# Patient Record
Sex: Female | Born: 2000 | Race: White | Hispanic: No | Marital: Single | Smoking: Never smoker
Health system: Southern US, Community
[De-identification: ages and names within clinical notes are randomized; demographics above are authoritative.]

## PROBLEM LIST (undated history)

## (undated) DIAGNOSIS — R209 Unspecified disturbances of skin sensation: Secondary | ICD-10-CM

## (undated) DIAGNOSIS — F840 Autistic disorder: Secondary | ICD-10-CM

## (undated) DIAGNOSIS — F32A Depression, unspecified: Secondary | ICD-10-CM

## (undated) DIAGNOSIS — F410 Panic disorder [episodic paroxysmal anxiety] without agoraphobia: Secondary | ICD-10-CM

## (undated) DIAGNOSIS — F319 Bipolar disorder, unspecified: Secondary | ICD-10-CM

## (undated) DIAGNOSIS — F419 Anxiety disorder, unspecified: Secondary | ICD-10-CM

## (undated) DIAGNOSIS — F988 Other specified behavioral and emotional disorders with onset usually occurring in childhood and adolescence: Secondary | ICD-10-CM

## (undated) DIAGNOSIS — F329 Major depressive disorder, single episode, unspecified: Secondary | ICD-10-CM

## (undated) DIAGNOSIS — F4320 Adjustment disorder, unspecified: Secondary | ICD-10-CM

## (undated) HISTORY — DX: Bipolar disorder, unspecified: F31.9

## (undated) HISTORY — DX: Autistic disorder: F84.0

## (undated) HISTORY — DX: Anxiety disorder, unspecified: F41.9

## (undated) HISTORY — DX: Depression, unspecified: F32.A

## (undated) HISTORY — DX: Major depressive disorder, single episode, unspecified: F32.9

## (undated) HISTORY — PX: TONSILLECTOMY: SUR1361

---
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
## 2022-04-21 ENCOUNTER — Ambulatory Visit
Admission: EM | Admit: 2022-04-21 | Discharge: 2022-04-21 | Disposition: A | Discharge status: Home / Self Care | Payer: Medicaid Other | Attending: Urgent Care | Admitting: Urgent Care

## 2022-04-21 DIAGNOSIS — J309 Allergic rhinitis, unspecified: Secondary | ICD-10-CM

## 2022-04-21 DIAGNOSIS — J320 Chronic maxillary sinusitis: Secondary | ICD-10-CM | POA: Diagnosis not present

## 2022-04-21 MED ORDER — PSEUDOEPHEDRINE HCL 60 MG PO TABS: 60.0000 mg | ORAL_TABLET | Freq: Three times a day (TID) | ORAL | 0 refills | Status: DC | PRN

## 2022-04-21 MED ORDER — AMOXICILLIN-POT CLAVULANATE 875-125 MG PO TABS: 1.0000 | ORAL_TABLET | Freq: Two times a day (BID) | ORAL | 0 refills | Status: DC

## 2022-04-21 MED ORDER — CETIRIZINE HCL 10 MG PO TABS: 10.0000 mg | ORAL_TABLET | Freq: Every day | ORAL | 3 refills | Status: DC

## 2022-04-21 NOTE — ED Provider Notes (Signed)
[facility]  Note:  This document was prepared using Conservation officer, historic buildings and may include unintentional dictation errors.  MRN: [id number] DOB: [birth year]  Subjective:   Rhonda Valdez is a 21 y.o. female presenting for 1 week history of persistent sinus congestion, postnasal drainage, coughing that is eliciting chest discomfort.  Patient has a history of allergic rhinitis but does not take any of her medications consistently for this.  Currently she has not taken anything chronically.  She does get Depo injections.  No overt shortness of breath or wheezing.  No current facility-administered medications for this encounter.  Current Outpatient Medications:    benzonatate (TESSALON) 100 MG capsule, Take 1 capsule (100 mg total) by mouth every 8 (eight) hours as needed for cough., Disp: 21 capsule, Rfl: 0   cetirizine (ZYRTEC) 10 MG tablet, Take 1 tablet (10 mg total) by mouth daily., Disp: 30 tablet, Rfl: 0   cloNIDine (CATAPRES) 0.1 MG tablet, Take 0.1 mg by mouth at bedtime., Disp: , Rfl:    DULoxetine (CYMBALTA) 20 MG capsule, Take 20 mg by mouth 2 (two) times daily., Disp: , Rfl:    fluticasone (FLONASE) 50 MCG/ACT nasal spray, Place 1 spray into both nostrils daily for 3 days., Disp: 16 g, Rfl: 0   hydrOXYzine (ATARAX/VISTARIL) 10 MG tablet, Take 10 mg by mouth daily as needed for anxiety., Disp: , Rfl:    lidocaine (LIDODERM) 5 %, Place 1 patch onto the skin daily. Remove & Discard patch within 12 hours or as directed by MD, Disp: 30 patch, Rfl: 0   medroxyPROGESTERone (DEPO-PROVERA) 150 MG/ML injection, Inject into the muscle., Disp: , Rfl:    Melatonin 5 MG TABS, Take 5 mg by mouth at bedtime., Disp: , Rfl:    Allergies  Allergen Reactions   Milk-Related Compounds Other (See Comments)    Lactose intolerance as a baby, now make her feel bad     Past Medical History:  Diagnosis Date   ADD (attention deficit disorder)    Adjustment disorder     Anxiety    Depression    Panic    Sensory disorder      Past Surgical History:  Procedure Laterality Date   TONSILLECTOMY      Family History  Problem Relation Age of Onset   Anxiety disorder Mother    Bipolar disorder Father    Sexual abuse Sister    Anxiety disorder Sister    Bipolar disorder Paternal Uncle    Bipolar disorder Paternal Grandfather     Social History   Tobacco Use   Smoking status: Never   Smokeless tobacco: Never  Vaping Use   Vaping Use: Never used  Substance Use Topics   Alcohol use: No   Drug use: No    ROS   Objective:   Vitals: BP (!) 142/84 (BP Location: Left Arm)   Pulse 89   Temp 99.2 F (37.3 C) (Oral)   Resp 20   SpO2 98%   Physical Exam Constitutional:      General: She is not in acute distress.    Appearance: Normal appearance. She is well-developed and normal weight. She is not ill-appearing, toxic-appearing or diaphoretic.  HENT:     Head: Normocephalic and atraumatic.     Right Ear: Tympanic membrane, ear canal and external ear normal. No drainage or tenderness. No middle ear effusion. There is no impacted cerumen. Tympanic membrane is not erythematous or bulging.     Left Ear:  Tympanic membrane, ear canal and external ear normal. No drainage or tenderness.  No middle ear effusion. There is no impacted cerumen. Tympanic membrane is not erythematous or bulging.     Nose: Congestion and rhinorrhea present.     Comments: Nasal mucosa boggy and erythematous over the left side, left-sided maxillary sinus tenderness.    Mouth/Throat:     Mouth: Mucous membranes are moist. No oral lesions.     Pharynx: No pharyngeal swelling, oropharyngeal exudate, posterior oropharyngeal erythema or uvula swelling.     Tonsils: No tonsillar exudate or tonsillar abscesses.     Comments: Significant postnasal drainage overlying pharynx. Eyes:     General: No scleral icterus.       Right eye: No discharge.        Left eye: No discharge.      Extraocular Movements: Extraocular movements intact.     Right eye: Normal extraocular motion.     Left eye: Normal extraocular motion.     Conjunctiva/sclera: Conjunctivae normal.  Cardiovascular:     Rate and Rhythm: Normal rate and regular rhythm.     Heart sounds: Normal heart sounds. No murmur heard.    No friction rub. No gallop.  Pulmonary:     Effort: Pulmonary effort is normal. No respiratory distress.     Breath sounds: No stridor. No wheezing, rhonchi or rales.  Chest:     Chest wall: No tenderness.  Musculoskeletal:     Cervical back: Normal range of motion and neck supple.  Lymphadenopathy:     Cervical: No cervical adenopathy.  Skin:    General: Skin is warm and dry.  Neurological:     General: No focal deficit present.     Mental Status: She is alert and oriented to person, place, and time.  Psychiatric:        Mood and Affect: Mood normal.        Behavior: Behavior normal.     Assessment and Plan :   PDMP not reviewed this encounter.  1. Left maxillary sinusitis   2. Allergic rhinitis, unspecified seasonality, unspecified trigger     Will start empiric treatment for sinusitis with Augmentin.  Recommended supportive care otherwise.  Will defer steroid use for now.  Deferred imaging given clear cardiopulmonary exam, hemodynamically stable vital signs. Counseled patient on potential for adverse effects with medications prescribed/recommended today, ER and return-to-clinic precautions discussed, patient verbalized understanding.    Wallis Bamberg, [place] 04/22/22 [phone]

## 2022-04-21 NOTE — ED Triage Notes (Signed)
Pt c/o cough, nasal congestion x 1 week-n/v/d x 2 days-NAD-steady gait

## 2022-05-28 ENCOUNTER — Ambulatory Visit: Admit: 2022-05-28 | Payer: No Typology Code available for payment source

## 2022-06-21 ENCOUNTER — Ambulatory Visit
Admission: RE | Admit: 2022-06-21 | Discharge: 2022-06-21 | Disposition: A | Discharge status: Home / Self Care | Payer: Medicaid Other | Source: Ambulatory Visit | Attending: Family Medicine | Admitting: Family Medicine

## 2022-06-21 VITALS — BP 121/82 | HR 90 | Temp 98.6°F | Resp 16

## 2022-06-21 DIAGNOSIS — J069 Acute upper respiratory infection, unspecified: Secondary | ICD-10-CM | POA: Diagnosis not present

## 2022-06-21 DIAGNOSIS — Z1152 Encounter for screening for COVID-19: Secondary | ICD-10-CM | POA: Insufficient documentation

## 2022-06-21 DIAGNOSIS — R058 Other specified cough: Secondary | ICD-10-CM | POA: Insufficient documentation

## 2022-06-21 DIAGNOSIS — Z793 Long term (current) use of hormonal contraceptives: Secondary | ICD-10-CM | POA: Diagnosis not present

## 2022-06-21 DIAGNOSIS — F4325 Adjustment disorder with mixed disturbance of emotions and conduct: Secondary | ICD-10-CM | POA: Insufficient documentation

## 2022-06-21 NOTE — ED Triage Notes (Signed)
Pt states fever,nasal congestion,green drainage from nose and headache for the past 4 days.  States she has been getting frequent sinus infections.

## 2022-06-21 NOTE — ED Provider Notes (Signed)
[facility]    CSN: [id number] Arrival date & time: 06/21/22  1331      History   Chief Complaint Chief Complaint  Patient presents with   Cough    HPI Rhonda Valdez is a 22 y.o. female.    Cough  Here for nasal congestion.  First on February 18 she had malaise and myalgia.  She then had some nausea and some diarrhea on February 19 and 20.  Then on 3/21 she developed nasal congestion and cough and some headache.  She has had subjective fever, but no chills.  No shortness of breath.  She mentions that she has had 2 other sinus infections in the last year.  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      Home Medications    Prior to Admission medications   Medication Sig Start Date End Date Taking? Authorizing Provider  cloNIDine (CATAPRES) 0.1 MG tablet Take 0.1 mg by mouth at bedtime.    [provider]  hydrOXYzine (ATARAX/VISTARIL) 10 MG tablet Take 10 mg by mouth daily as needed for anxiety.    [provider]  medroxyPROGESTERone (DEPO-PROVERA) 150 MG/ML injection Inject into the muscle. 07/27/19   [provider]    Family History Family History  Problem Relation Age of Onset   Anxiety disorder Mother    Bipolar disorder Father    Sexual abuse Sister    Anxiety disorder Sister    Bipolar disorder Paternal Uncle    Bipolar disorder Paternal Grandfather     Social  History Social History   Tobacco Use   Smoking status: Never   Smokeless tobacco: Never  Vaping Use   Vaping Use: Never used  Substance Use Topics   Alcohol use: No   Drug use: No     Allergies   Milk-related compounds   Review of Systems Review of Systems  Respiratory:  Positive for cough.      Physical Exam Triage Vital Signs ED Triage Vitals  Enc Vitals Group     BP 06/21/22 1352 121/82     Pulse Rate 06/21/22 1352 90     Resp 06/21/22 1352 16     Temp 06/21/22 1352 98.6 F (37 C)     Temp Source 06/21/22 1352 Oral     SpO2 06/21/22 1352 98 %     Weight --      Height --      Head Circumference --      Peak Flow --      Pain Score 06/21/22 1353 3     Pain Loc --      Pain Edu? --      Excl. in [place]? --    No data found.  Updated Vital Signs BP 121/82 (BP Location: Left Arm)   Pulse 90   Temp 98.6 F (37 C) (  Oral)   Resp 16   SpO2 98%   Visual Acuity Right Eye Distance:   Left Eye Distance:   Bilateral Distance:    Right Eye Near:   Left Eye Near:    Bilateral Near:     Physical Exam Vitals reviewed.  Constitutional:      General: She is not in acute distress.    Appearance: She is not toxic-appearing.  HENT:     Right Ear: Tympanic membrane and ear canal normal.     Left Ear: Tympanic membrane and ear canal normal.     Nose: Congestion present.     Mouth/Throat:     Mouth: Mucous membranes are moist.     Pharynx: No oropharyngeal exudate (clear mucus in OP) or posterior oropharyngeal erythema.  Eyes:     Extraocular Movements: Extraocular movements intact.     Conjunctiva/sclera: Conjunctivae normal.     Pupils: Pupils are equal, round, and reactive to light.  Cardiovascular:     Rate and Rhythm: Normal rate and regular rhythm.     Heart sounds: No murmur heard. Pulmonary:     Effort: Pulmonary effort is normal. No respiratory distress.     Breath sounds: No stridor. No wheezing, rhonchi or rales.  Musculoskeletal:     Cervical  back: Neck supple.  Lymphadenopathy:     Cervical: No cervical adenopathy.  Skin:    Capillary Refill: Capillary refill takes less than 2 seconds.     Coloration: Skin is not jaundiced or pale.  Neurological:     General: No focal deficit present.     Mental Status: She is alert and oriented to person, place, and time.  Psychiatric:        Behavior: Behavior normal.      UC Treatments / Results  Labs (all labs ordered are listed, but only abnormal results are displayed) Labs Reviewed  SARS CORONAVIRUS 2 (TAT 6-24 HRS)    EKG   Radiology No results found.  Procedures Procedures (including critical care time)  Medications Ordered in UC Medications - No data to display  Initial Impression / Assessment and Plan / UC Course  I have reviewed the triage vital signs and the nursing notes.  Pertinent labs & imaging results that were available during my care of the patient were reviewed by me and considered in my medical decision making (see chart for details).        I discussed with her that in this instance this is most likely a viral upper respiratory infection.  Symptomatic treatment and she is swabbed for COVID.  If she is positive she will know if she needs to quarantine.  Blood pressure is good today and she is not on medication for blood pressure. Final Clinical Impressions(s) / UC Diagnoses   Final diagnoses:  Viral URI     Discharge Instructions       You have been swabbed for COVID, and the test will result in the next 24 hours. Our staff will call you if positive. If the COVID test is positive, you should quarantine for 5 days from the start of your symptoms.  On days 6-10 from the start of your illness, you should wear a mask if out in public.  Try NyQuil/DayQuil or Mucinex D as needed for your congestion.  Saline nasal spray can also help wash out the congestion in your nose.  You can use the QR code/website at the back of the summary paperwork to  schedule yourself a  new patient appointment with primary care      ED Prescriptions   None    PDMP not reviewed this encounter.   Barrett Henle, MD 06/21/22 [phone]

## 2022-06-21 NOTE — Discharge Instructions (Signed)
  You have been swabbed for COVID, and the test will result in the next 24 hours. Our staff will call you if positive. If the COVID test is positive, you should quarantine for 5 days from the start of your symptoms.  On days 6-10 from the start of your illness, you should wear a mask if out in public.  Try NyQuil/DayQuil or Mucinex D as needed for your congestion.  Saline nasal spray can also help wash out the congestion in your nose.  You can use the QR code/website at the back of the summary paperwork to schedule yourself a new patient appointment with primary care

## 2022-06-22 LAB — SARS CORONAVIRUS 2 (TAT 6-24 HRS): SARS Coronavirus 2: NEGATIVE

## 2022-08-27 ENCOUNTER — Encounter: Payer: Self-pay | Admitting: Emergency Medicine

## 2022-08-27 ENCOUNTER — Ambulatory Visit
Admission: EM | Admit: 2022-08-27 | Discharge: 2022-08-27 | Disposition: A | Discharge status: Home / Self Care | Payer: Medicaid Other | Attending: Nurse Practitioner | Admitting: Nurse Practitioner

## 2022-08-27 DIAGNOSIS — Z1152 Encounter for screening for COVID-19: Secondary | ICD-10-CM | POA: Diagnosis not present

## 2022-08-27 DIAGNOSIS — J0100 Acute maxillary sinusitis, unspecified: Secondary | ICD-10-CM | POA: Diagnosis not present

## 2022-08-27 DIAGNOSIS — R0981 Nasal congestion: Secondary | ICD-10-CM | POA: Diagnosis present

## 2022-08-27 DIAGNOSIS — J309 Allergic rhinitis, unspecified: Secondary | ICD-10-CM | POA: Diagnosis present

## 2022-08-27 DIAGNOSIS — M791 Myalgia, unspecified site: Secondary | ICD-10-CM | POA: Diagnosis present

## 2022-08-27 MED ORDER — PREDNISONE 20 MG PO TABS: 40.0000 mg | ORAL_TABLET | Freq: Every day | ORAL | 0 refills | Status: AC

## 2022-08-27 MED ORDER — NAPROXEN 500 MG PO TABS: 500.0000 mg | ORAL_TABLET | Freq: Two times a day (BID) | ORAL | 0 refills | Status: AC

## 2022-08-27 NOTE — Discharge Instructions (Signed)
The clinic will contact you with results of the COVID testing done today if positive I would try changing your allergy medication from Zyrtec to Claritin or Allegra or Xyzal Prednisone daily for 5 days Nasal rinses as tolerated Naproxen twice daily for 5 days for your muscle pain.  Would also use heat to the area with heating pad or warm showers Follow-up with ENT if your symptoms or not improving Please go to the emergency room for any worsening symptoms

## 2022-08-27 NOTE — ED Provider Notes (Signed)
[facility]    CSN: [id number] Arrival date & time: 08/27/22  6578      History   Chief Complaint Chief Complaint  Patient presents with   Nasal Congestion    HPI Rhonda Valdez is a 22 y.o. female  presents for evaluation of URI symptoms for 2 days. Patient reports associated symptoms of sinus pressure/congestion, throat congestion with globus sensation.  She does states she blew out some yellow/green discharge from her nose that seem to help and prove her symptoms.  Now she primarily reports postnasal drip with throat congestion.  Denies N/V/D, fevers, cough, sore throat, body aches, ear pain, shortness of breath. 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 No known sick contacts.  In addition patient reports 2 days of neck pain that extends to upper back.  States is a constant aching pain.  Denies any injury or known inciting event.  Has not taken any medications for this.  Pt has taken nothing OTC for symptoms. Pt has no other concerns at this time.   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      Home Medications    Prior to Admission medications   Medication Sig Start Date End Date Taking? Authorizing Provider  naproxen (NAPROSYN) 500 MG tablet Take 1 tablet (500 mg total) by mouth 2 (two) times daily for 5 days. 08/27/22 09/01/22 Yes Radford Pax, NP  predniSONE (DELTASONE) 20 MG tablet Take 2 tablets (40 mg total) by mouth daily with  breakfast for 5 days. 08/27/22 09/01/22 Yes Radford Pax, NP  cloNIDine (CATAPRES) 0.1 MG tablet Take 0.1 mg by mouth at bedtime.    [provider]  hydrOXYzine (ATARAX/VISTARIL) 10 MG tablet Take 10 mg by mouth daily as needed for anxiety.    [provider]  medroxyPROGESTERone (DEPO-PROVERA) 150 MG/ML injection Inject into the muscle. 07/27/19   [provider]    Family History Family History  Problem Relation Age of Onset   Anxiety disorder Mother    Bipolar disorder Father    Sexual abuse Sister    Anxiety disorder Sister    Bipolar disorder Paternal Uncle    Bipolar disorder Paternal Grandfather     Social History Social History   Tobacco Use   Smoking status: Never   Smokeless tobacco: Never  Vaping Use   Vaping Use: Never used  Substance Use Topics   Alcohol use: No   Drug use: No     Allergies   Milk-related compounds   Review of Systems Review of Systems  HENT:  Positive for congestion, postnasal drip and sinus pressure.   Musculoskeletal:  Positive for neck pain.     Physical Exam Triage Vital Signs ED Triage Vitals  Enc Vitals Group     BP 08/27/22 0841 106/71     Pulse Rate 08/27/22 0841 71     Resp 08/27/22 0841 15  Temp 08/27/22 0841 98.9 F (37.2 C)     Temp Source 08/27/22 0841 Oral     SpO2 08/27/22 0841 99 %     Weight --      Height --      Head Circumference --      Peak Flow --      Pain Score 08/27/22 0840 5     Pain Loc --      Pain Edu? --      Excl. in GC? --    No data found.  Updated Vital Signs BP 106/71 (BP Location: Left Arm)   Pulse 71   Temp 98.9 F (37.2 C) (Oral)   Resp 15   SpO2 99%   Visual Acuity Right Eye Distance:   Left Eye Distance:   Bilateral Distance:    Right Eye Near:   Left Eye Near:    Bilateral Near:     Physical Exam Vitals and nursing note reviewed.  Constitutional:      General: She is not in acute distress.    Appearance: She is well-developed. She is  not ill-appearing.  HENT:     Head: Normocephalic and atraumatic.     Right Ear: Tympanic membrane and ear canal normal.     Left Ear: Tympanic membrane and ear canal normal.     Nose: Congestion and rhinorrhea present.     Right Turbinates: Not swollen or pale.     Left Turbinates: Not swollen or pale.     Right Sinus: Maxillary sinus tenderness present. No frontal sinus tenderness.     Left Sinus: Maxillary sinus tenderness present. No frontal sinus tenderness.     Mouth/Throat:     Mouth: Mucous membranes are moist.     Pharynx: Oropharynx is clear. Uvula midline. No oropharyngeal exudate or posterior oropharyngeal erythema.     Tonsils: No tonsillar exudate or tonsillar abscesses.  Eyes:     Conjunctiva/sclera: Conjunctivae normal.     Pupils: Pupils are equal, round, and reactive to light.  Neck:     Comments: Mild tenderness to palpation to bilateral paracervical spinal muscle muscles that extends to bilateral trapezius muscles.  Full range of motion of neck with pain on active flexion and extension.  Strength 5 out of 5 bilateral upper extremities. Cardiovascular:     Rate and Rhythm: Normal rate and regular rhythm.     Heart sounds: Normal heart sounds.  Pulmonary:     Effort: Pulmonary effort is normal.     Breath sounds: Normal breath sounds.  Musculoskeletal:     Cervical back: Normal range of motion and neck supple. No erythema, signs of trauma, rigidity or torticollis. Pain with movement and muscular tenderness present. No spinous process tenderness. Normal range of motion.  Lymphadenopathy:     Cervical: No cervical adenopathy.  Skin:    General: Skin is warm and dry.  Neurological:     General: No focal deficit present.     Mental Status: She is alert and oriented to person, place, and time.  Psychiatric:        Mood and Affect: Mood normal.        Behavior: Behavior normal.      UC Treatments / Results  Labs (all labs ordered are listed, but only abnormal  results are displayed) Labs Reviewed  SARS CORONAVIRUS 2 (TAT 6-24 HRS)   Recent Results (from the past 2160 hour(s))  SARS CORONAVIRUS 2 (TAT 6-24 HRS) Anterior Nasal Swab  Status: None   Collection Time: 06/21/22  2:15 PM   Specimen: Anterior Nasal Swab  Result Value Ref Range   SARS Coronavirus 2 NEGATIVE NEGATIVE    Comment: (NOTE) SARS-CoV-2 target nucleic acids are NOT DETECTED.  The SARS-CoV-2 RNA is generally detectable in upper and lower respiratory specimens during the acute phase of infection. Negative results do not preclude SARS-CoV-2 infection, do not rule out co-infections with other pathogens, and should not be used as the sole basis for treatment or other patient management decisions. Negative results must be combined with clinical observations, patient history, and epidemiological information. The expected result is Negative.  Fact Sheet for Patients: HairSlick.no  Fact Sheet for Healthcare Providers: quierodirigir.com  This test is not yet approved or cleared by the [place] FDA and  has been authorized for detection and/or diagnosis of SARS-CoV-2 by FDA under an Emergency Use Authorization (EUA). This EUA will remain  in effect (meaning this test can be used) for the duration of the COVID-19 declaration under Se ction 564(b)(1) of the Act, 21 U.S.C. section 360bbb-3(b)(1), unless the authorization is terminated or revoked sooner.  Performed at [facility], [address], [place], [place] [address]     EKG   Radiology No results found.  Procedures Procedures (including critical care time)  Medications Ordered in UC Medications - No data to display  Initial Impression / Assessment and Plan / UC Course  I have reviewed the triage vital signs and the nursing notes.  Pertinent labs & imaging results that were available during my care of the patient were reviewed by me and  considered in my medical decision making (see chart for details).     Reviewed recent labs and progress notes Reviewed exam and symptoms with patient.  No red flags. COVID PCR and will contact if positive Discussed allergic's rhinitis/sinusitis.  As she has been taking Zyrtec advised to change to Allegra, Xyzal, or Claritin Prednisone daily for 5 days Encourage nasal rinses Discussed muscle tension as cause of neck/upper back pain.  Trial of naproxen x 5 days Heat to the back and neck as needed Advised PCP or ENT follow-up if symptoms do not improve ER precautions reviewed and patient verbalized understanding Final Clinical Impressions(s) / UC Diagnoses   Final diagnoses:  Sinus congestion  Allergic rhinitis, unspecified seasonality, unspecified trigger  Acute non-recurrent maxillary sinusitis  Muscle tension pain     Discharge Instructions      The clinic will contact you with results of the COVID testing done today if positive I would try changing your allergy medication from Zyrtec to Claritin or Allegra or Xyzal Prednisone daily for 5 days Nasal rinses as tolerated Naproxen twice daily for 5 days for your muscle pain.  Would also use heat to the area with heating pad or warm showers Follow-up with ENT if your symptoms or not improving Please go to the emergency room for any worsening symptoms   ED Prescriptions     Medication Sig Dispense Auth. Provider   predniSONE (DELTASONE) 20 MG tablet Take 2 tablets (40 mg total) by mouth daily with breakfast for 5 days. 10 tablet Radford Pax, NP   naproxen (NAPROSYN) 500 MG tablet Take 1 tablet (500 mg total) by mouth 2 (two) times daily for 5 days. 10 tablet Radford Pax, NP      PDMP not reviewed this encounter.   Radford Pax, NP 08/27/22 [phone]

## 2022-08-27 NOTE — ED Triage Notes (Signed)
Pt having sinus pressure and congestion that has gotten worse. Reports had thick green-brown blob out of nose and felt temporarily relief in sinuses. Hasn't had medication for congestion   Having neck and back pain that started yesterday. Worse with twisting. Denies falls, lifting, injury. Denies taking medication for pain

## 2022-08-28 LAB — SARS CORONAVIRUS 2 (TAT 6-24 HRS): SARS Coronavirus 2: NEGATIVE

## 2022-09-09 ENCOUNTER — Ambulatory Visit
Admission: RE | Admit: 2022-09-09 | Discharge: 2022-09-09 | Disposition: A | Discharge status: Home / Self Care | Payer: Medicaid Other | Source: Ambulatory Visit | Attending: Family Medicine | Admitting: Family Medicine

## 2022-09-09 VITALS — BP 119/78 | HR 89 | Temp 99.8°F | Resp 17

## 2022-09-09 DIAGNOSIS — J019 Acute sinusitis, unspecified: Secondary | ICD-10-CM | POA: Diagnosis not present

## 2022-09-09 DIAGNOSIS — H1031 Unspecified acute conjunctivitis, right eye: Secondary | ICD-10-CM

## 2022-09-09 MED ORDER — CEFDINIR 300 MG PO CAPS: 600.0000 mg | ORAL_CAPSULE | Freq: Every day | ORAL | 0 refills | Status: AC

## 2022-09-09 MED ORDER — GENTAMICIN SULFATE 0.3 % OP SOLN: 2.0000 [drp] | Freq: Three times a day (TID) | OPHTHALMIC | 0 refills | Status: AC

## 2022-09-09 NOTE — Discharge Instructions (Signed)
Take cefdinir 300 mg--2 capsules together daily for 7 days  Put gentamicin eyedrops in the affected eyes 3 times daily for 5 days.  

## 2022-09-09 NOTE — ED Provider Notes (Signed)
[facility]    CSN: [id number] Arrival date & time: 09/09/22  1407      History   Chief Complaint Chief Complaint  Patient presents with   Eye Problem    Nasal drainage, pressure behind eye, red, watery. Painful. Loss of voice. - Entered by patient   Nasal Congestion         HPI Rhonda Valdez is a 22 y.o. female.    Eye Problem  Here for nasal congestion and mild pressure behind her right eye. She was seen here on April 29 with some sinus congestion and nasal drainage.  She was prescribed some steroids and she did improve some but she never completely had her nasal congestion go away.  Then about 5 or 6 days ago she started having worse congestion and nasal drainage.  She also started having some sinus pressure behind her right eye in the last day and now her right eye is red and irritated.  No fever noted at home, but her temperature is 99.8.  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      Home Medications    Prior to Admission medications   Medication Sig Start Date End Date Taking? Authorizing Provider  cefdinir (OMNICEF) 300 MG capsule Take 2 capsules (600 mg total) by mouth daily for 7 days. 09/09/22 09/16/22 Yes Sharifa Bucholz, Janace Aris, MD  gentamicin (GARAMYCIN) 0.3 % ophthalmic solution Place 2 drops into the right eye 3 (three) times daily for 5 days. 09/09/22 09/14/22 Yes Feliza Diven, Janace Aris, MD  cloNIDine (CATAPRES)  0.1 MG tablet Take 0.1 mg by mouth at bedtime.    [provider]  hydrOXYzine (ATARAX/VISTARIL) 10 MG tablet Take 10 mg by mouth daily as needed for anxiety.    [provider]  medroxyPROGESTERone (DEPO-PROVERA) 150 MG/ML injection Inject into the muscle. 07/27/19   [provider]    Family History Family History  Problem Relation Age of Onset   Anxiety disorder Mother    Bipolar disorder Father    Sexual abuse Sister    Anxiety disorder Sister    Bipolar disorder Paternal Uncle    Bipolar disorder Paternal Grandfather     Social History Social History   Tobacco Use   Smoking status: Never   Smokeless tobacco: Never  Vaping Use   Vaping Use: Never used  Substance Use Topics   Alcohol use: Not Currently   Drug use: No     Allergies   Latex and Milk-related compounds   Review of Systems Review of Systems   Physical Exam Triage Vital Signs ED Triage Vitals  Enc Vitals Group     BP 09/09/22 1416 119/78     Pulse Rate 09/09/22 1416 89     Resp 09/09/22 1416 17     Temp 09/09/22 1416 99.8 F (37.7 C)     Temp Source  09/09/22 1416 Oral     SpO2 09/09/22 1416 98 %     Weight --      Height --      Head Circumference --      Peak Flow --      Pain Score 09/09/22 1419 0     Pain Loc --      Pain Edu? --      Excl. in GC? --    No data found.  Updated Vital Signs BP 119/78 (BP Location: Right Arm)   Pulse 89   Temp 99.8 F (37.7 C) (Oral)   Resp 17   SpO2 98%   Visual Acuity Right Eye Distance:   Left Eye Distance:   Bilateral Distance:    Right Eye Near:   Left Eye Near:    Bilateral Near:     Physical Exam Vitals reviewed.  Constitutional:      General: She is not in acute distress.    Appearance: She is not toxic-appearing.  HENT:     Right Ear: Tympanic membrane and ear canal normal.     Left Ear: Tympanic membrane and ear canal normal.     Nose: Congestion present.     Mouth/Throat:     Mouth: Mucous  membranes are moist.     Comments: There is clear mucus and mild erythema in the posterior oropharynx Eyes:     Extraocular Movements: Extraocular movements intact.     Pupils: Pupils are equal, round, and reactive to light.     Comments: Left eye is normal.  Right conjunctiva is injected.  There is some yellow discharge at the inner canthus.  The lids are normal  Cardiovascular:     Rate and Rhythm: Normal rate and regular rhythm.     Heart sounds: No murmur heard. Pulmonary:     Effort: Pulmonary effort is normal. No respiratory distress.     Breath sounds: No stridor. No wheezing, rhonchi or rales.  Musculoskeletal:     Cervical back: Neck supple.  Lymphadenopathy:     Cervical: No cervical adenopathy.  Skin:    Capillary Refill: Capillary refill takes less than 2 seconds.     Coloration: Skin is not jaundiced or pale.  Neurological:     General: No focal deficit present.     Mental Status: She is alert and oriented to person, place, and time.  Psychiatric:        Behavior: Behavior normal.      UC Treatments / Results  Labs (all labs ordered are listed, but only abnormal results are displayed) Labs Reviewed - No data to display  EKG   Radiology No results found.  Procedures Procedures (including critical care time)  Medications Ordered in UC Medications - No data to display  Initial Impression / Assessment and Plan / UC Course  I have reviewed the triage vital signs and the nursing notes.  Pertinent labs & imaging results that were available during my care of the patient were reviewed by me and considered in my medical decision making (see chart for details).     [name] is sent in to treat the sinus infection and gentamicin is sent in to treat the conjunctivitis.  Due to the length of symptoms, no viral testing is done Final Clinical Impressions(s) / UC Diagnoses   Final diagnoses:  Acute sinusitis, recurrence not specified, unspecified location  Acute  conjunctivitis of right eye, unspecified acute conjunctivitis type     Discharge Instructions  Take cefdinir 300 mg--2 capsules together daily for 7 days  Put gentamicin eyedrops in the affected eye(s) 3 times daily for 5 days.       ED Prescriptions     Medication Sig Dispense Auth. Provider   cefdinir (OMNICEF) 300 MG capsule Take 2 capsules (600 mg total) by mouth daily for 7 days. 14 capsule Zenia Resides, MD   gentamicin (GARAMYCIN) 0.3 % ophthalmic solution Place 2 drops into the right eye 3 (three) times daily for 5 days. 5 mL Zenia Resides, MD      PDMP not reviewed this encounter.   Zenia Resides, MD 09/09/22 [phone]

## 2022-09-09 NOTE — ED Triage Notes (Signed)
Pt reports nasal drainage x 4 days; loss of voice x 2 days; pressure, redness and drainage in left aye start today around 4 am today. Pt has not taken any meds for complaints.

## 2022-09-14 ENCOUNTER — Ambulatory Visit
Admission: RE | Admit: 2022-09-14 | Discharge: 2022-09-14 | Disposition: A | Discharge status: Home / Self Care | Payer: Medicaid Other | Source: Ambulatory Visit | Attending: Pediatrics | Admitting: Pediatrics

## 2022-09-14 VITALS — BP 113/73 | HR 69 | Temp 98.5°F | Resp 18

## 2022-09-14 DIAGNOSIS — Z0279 Encounter for issue of other medical certificate: Secondary | ICD-10-CM | POA: Diagnosis not present

## 2022-09-14 DIAGNOSIS — Z7689 Persons encountering health services in other specified circumstances: Secondary | ICD-10-CM

## 2022-09-14 NOTE — ED Triage Notes (Signed)
Pt presents to UC for a work note. Pt states she was feeling nauseas, is feeling better now and is being asked for a work note to return.

## 2022-09-14 NOTE — ED Provider Notes (Signed)
[facility]    CSN: [id number] Arrival date & time: 09/14/22  1636      History   Chief Complaint Chief Complaint  Patient presents with   Nausea    Sent home from work Tuesday night for nausea/dizziness need note saying I can return to work. - Entered by patient    HPI Alfretta Enberg is a 22 y.o. female.   HPI Patient seen at [facility] urgent care on 09/09/2022 and subsequently was sent home from work after returning to work on the date that the provider wrote her back into work.  Has not been out of work for the last 3 days and needs a note to be able to return back to her place of employment without restrictions.  She reports that she is asymptomatic of any sinus symptoms and has not had any nausea or fever.  She is completing the last few days of her prescribed antibiotic.  She desires to return to work today.  She has no other concerns today.  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      Home Medications    Prior to Admission medications   Medication Sig Start Date End Date Taking? Authorizing Provider  cefdinir (OMNICEF) 300 MG capsule Take 2 capsules (600 mg total) by mouth daily for 7 days. 09/09/22 09/16/22  Zenia Resides, MD  cloNIDine (CATAPRES) 0.1 MG tablet Take 0.1 mg by mouth at bedtime.    [provider]  hydrOXYzine (ATARAX/VISTARIL) 10 MG tablet Take 10 mg by mouth daily as needed for anxiety.    [provider]  medroxyPROGESTERone  (DEPO-PROVERA) 150 MG/ML injection Inject into the muscle. 07/27/19   [provider]    Family History Family History  Problem Relation Age of Onset   Anxiety disorder Mother    Bipolar disorder Father    Sexual abuse Sister    Anxiety disorder Sister    Bipolar disorder Paternal Uncle    Bipolar disorder Paternal Grandfather     Social History Social History   Tobacco Use   Smoking status: Never   Smokeless tobacco: Never  Vaping Use   Vaping Use: Never used  Substance Use Topics   Alcohol use: Not Currently   Drug use: No     Allergies   Latex and Milk-related compounds   Review of Systems Review of Systems Pertinent negatives listed in HPI   Physical Exam Triage Vital Signs ED Triage Vitals [09/14/22 1649]  Enc Vitals Group     BP 113/73     Pulse Rate 69     Resp 18     Temp 98.5 F (36.9 C)     Temp src      SpO2 98 %     Weight      Height      Head Circumference      Peak Flow      Pain Score 0  Pain Loc      Pain Edu?      Excl. in GC?    No data found.  Updated Vital Signs BP 113/73 (BP Location: Left Arm)   Pulse 69   Temp 98.5 F (36.9 C)   Resp 18   SpO2 98%   Visual Acuity Right Eye Distance:   Left Eye Distance:   Bilateral Distance:    Right Eye Near:   Left Eye Near:    Bilateral Near:     Physical Exam General appearance: Alert, well developed, well nourished, cooperative and in no distress Head: Normocephalic, without obvious abnormality, atraumatic Heart: Rate and rhythm normal. No gallop or murmurs noted on exam  Respiratory: Respirations even and unlabored, normal respiratory rate Extremities: No gross deformities Skin: Skin color, texture, turgor normal. No rashes seen  Psych: Appropriate mood and affect. UC Treatments / Results  Labs (all labs ordered are listed, but only abnormal results are displayed) Labs Reviewed - No data to display  EKG   Radiology No results  found.  Procedures Procedures (including critical care time)  Medications Ordered in UC Medications - No data to display  Initial Impression / Assessment and Plan / UC Course  I have reviewed the triage vital signs and the nursing notes.  Pertinent labs & imaging results that were available during my care of the patient were reviewed by me and considered in my medical decision making (see chart for details).    Return to work note given indicating patient can return to work today without any restrictions.  Return precautions given. Final Clinical Impressions(s) / UC Diagnoses   Final diagnoses:  Return to work evaluation   Discharge Instructions   None    ED Prescriptions   None    PDMP not reviewed this encounter.   Bing Neighbors, NP 09/16/22 1840

## 2022-10-11 ENCOUNTER — Ambulatory Visit
Admission: RE | Admit: 2022-10-11 | Discharge: 2022-10-11 | Disposition: A | Discharge status: Home / Self Care | Payer: Medicaid Other | Source: Ambulatory Visit | Attending: Family Medicine | Admitting: Family Medicine

## 2022-10-11 VITALS — BP 121/82 | HR 82 | Temp 99.2°F | Resp 16

## 2022-10-11 DIAGNOSIS — J069 Acute upper respiratory infection, unspecified: Secondary | ICD-10-CM

## 2022-10-11 MED ORDER — BENZONATATE 100 MG PO CAPS: 100.0000 mg | ORAL_CAPSULE | Freq: Three times a day (TID) | ORAL | 0 refills | Status: DC | PRN

## 2022-10-11 MED ORDER — ONDANSETRON 4 MG PO TBDP: 4.0000 mg | ORAL_TABLET | Freq: Three times a day (TID) | ORAL | 0 refills | Status: DC | PRN

## 2022-10-11 NOTE — ED Provider Notes (Addendum)
[facility]    CSN: [id number] Arrival date & time: 10/11/22  1759      History   Chief Complaint Chief Complaint  Patient presents with   Nasal Congestion    Headache, small cough, itchy throat, runny nose, body aches, low fever, film over eyes. - Entered by patient    HPI Rhonda Valdez is a 22 y.o. female.   HPI Here for headache and runny nose and myalgia.  Her temperature has been as high as 100.5. She has had some nausea but no vomiting and no diarrhea.  She has had a little bit of cough and itchiness in her throat.  Her eyes have been crusty, but no redness or irritation   Past Medical History:  Diagnosis Date   ADD (attention deficit disorder)    Adjustment disorder    Anxiety    Depression    Panic    Sensory disorder     Patient Active Problem List   Diagnosis Date Noted   Chronic tension-type headache, not intractable 05/30/2021   Low back pain 05/30/2021   Migraine with aura and without status migrainosus, not intractable 05/30/2021   Neck pain 05/30/2021   Pectus excavatum 03/21/2020   Snoring 05/10/2012   Second hand smoke exposure 03/31/2012   Depression 03/20/2012   Allergic rhinitis 03/20/2012   Asthma 02/05/2012   ADHD (attention deficit hyperactivity disorder), 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      Home Medications    Prior to Admission medications   Medication Sig Start Date End Date Taking? Authorizing Provider  benzonatate (TESSALON) 100 MG capsule Take 1 capsule (100 mg total) by mouth 3 (three) times daily as needed for cough. 10/11/22  Yes Zenia Resides, MD  Melatonin 10 MG TABS Take by mouth. 08/18/13  Yes [provider]  ondansetron (ZOFRAN-ODT) 4 MG disintegrating tablet  Take 1 tablet (4 mg total) by mouth every 8 (eight) hours as needed for nausea or vomiting. 10/11/22  Yes Zenia Resides, MD  clindamycin (CLEOCIN T) 1 % external solution Apply to affected areas twice daily 08/29/16   [provider]  hydrocortisone 2.5 % cream Apply to dry skin behind ears twice daily 1 week 08/29/16   [provider]    Family History Family History  Problem Relation Age of Onset   Anxiety disorder Mother    Bipolar disorder Father    Sexual abuse Sister    Anxiety disorder Sister    Bipolar disorder Paternal Uncle    Bipolar disorder Paternal Grandfather     Social History Social History   Tobacco Use   Smoking status: Never   Smokeless tobacco: Never  Vaping Use   Vaping Use: Never used  Substance Use Topics   Alcohol use: Not Currently   Drug use: No     Allergies   Latex, Milk (cow), and Milk-related compounds   Review of Systems Review of Systems   Physical Exam Triage Vital Signs ED Triage Vitals [10/11/22 1826]  Enc Vitals Group     BP 121/82     Pulse Rate 82     Resp 16     Temp 99.2 F (37.3 C)     Temp Source Oral  SpO2 97 %     Weight      Height      Head Circumference      Peak Flow      Pain Score      Pain Loc      Pain Edu?      Excl. in GC?    No data found.  Updated Vital Signs BP 121/82 (BP Location: Left Arm)   Pulse 82   Temp 99.2 F (37.3 C) (Oral)   Resp 16   SpO2 97%   Visual Acuity Right Eye Distance:   Left Eye Distance:   Bilateral Distance:    Right Eye Near:   Left Eye Near:    Bilateral Near:     Physical Exam Vitals reviewed.  Constitutional:      General: She is not in acute distress.    Appearance: She is not ill-appearing, toxic-appearing or diaphoretic.  HENT:     Right Ear: Tympanic membrane and ear canal normal.     Left Ear: Tympanic membrane and ear canal normal.     Nose: Congestion present.     Mouth/Throat:     Mouth: Mucous membranes are moist.      Pharynx: No oropharyngeal exudate or posterior oropharyngeal erythema.  Eyes:     Extraocular Movements: Extraocular movements intact.     Conjunctiva/sclera: Conjunctivae normal.     Pupils: Pupils are equal, round, and reactive to light.     Comments: There is no injection of either eye today.  Cardiovascular:     Rate and Rhythm: Normal rate and regular rhythm.     Heart sounds: No murmur heard. Pulmonary:     Effort: Pulmonary effort is normal. No respiratory distress.     Breath sounds: No stridor. No wheezing, rhonchi or rales.  Musculoskeletal:     Cervical back: Neck supple.  Lymphadenopathy:     Cervical: No cervical adenopathy.  Skin:    Capillary Refill: Capillary refill takes less than 2 seconds.     Coloration: Skin is not jaundiced or pale.  Neurological:     General: No focal deficit present.     Mental Status: She is alert and oriented to person, place, and time.  Psychiatric:        Behavior: Behavior normal.      UC Treatments / Results  Labs (all labs ordered are listed, but only abnormal results are displayed) Labs Reviewed  SARS CORONAVIRUS 2 (TAT 6-24 HRS)    EKG   Radiology No results found.  Procedures Procedures (including critical care time)  Medications Ordered in UC Medications - No data to display  Initial Impression / Assessment and Plan / UC Course  I have reviewed the triage vital signs and the nursing notes.  Pertinent labs & imaging results that were available during my care of the patient were reviewed by me and considered in my medical decision making (see chart for details).     Zofran is sent in for the nausea and Tessalon Perles are sent in for the cough.  We ended up not doing a COVID swab. Final Clinical Impressions(s) / UC Diagnoses   Final diagnoses:  Viral URI     Discharge Instructions      Ondansetron dissolved in the mouth every 8 hours as needed for nausea or vomiting. Clear liquids(water,  gatorade/pedialyte, ginger ale/sprite, chicken broth/soup) and bland things(crackers/toast, rice, potato, bananas) to eat. Avoid acidic foods like lemon/lime/orange/tomato, and avoid greasy/spicy foods.  Take  benzonatate 100 mg, 1 tab every 8 hours as needed for cough.   You have been swabbed for COVID, and the test will result in the next 24 hours. Our staff will call you if positive. If the COVID test is positive, you should quarantine until you are fever free for 24 hours and you are starting to feel better, and then take added precautions for the next 5 days, such as physical distancing/wearing a mask and good hand hygiene/washing.       ED Prescriptions     Medication Sig Dispense Auth. Provider   benzonatate (TESSALON) 100 MG capsule Take 1 capsule (100 mg total) by mouth 3 (three) times daily as needed for cough. 21 capsule Zenia Resides, MD   ondansetron (ZOFRAN-ODT) 4 MG disintegrating tablet Take 1 tablet (4 mg total) by mouth every 8 (eight) hours as needed for nausea or vomiting. 10 tablet Marlinda Mike Janace Aris, MD      PDMP not reviewed this encounter.   Zenia Resides, MD 10/11/22 Jaye Beagle    Zenia Resides, MD 10/11/22 [phone]

## 2022-10-11 NOTE — ED Triage Notes (Signed)
Presents with nasal congestion,headache and cough. Pt is waking up with eyes matted.

## 2022-10-11 NOTE — Discharge Instructions (Addendum)
Ondansetron dissolved in the mouth every 8 hours as needed for nausea or vomiting. Clear liquids(water, gatorade/pedialyte, ginger ale/sprite, chicken broth/soup) and bland things(crackers/toast, rice, potato, bananas) to eat. Avoid acidic foods like lemon/lime/orange/tomato, and avoid greasy/spicy foods.  Take benzonatate 100 mg, 1 tab every 8 hours as needed for cough.   You have been swabbed for COVID, and the test will result in the next 24 hours. Our staff will call you if positive. If the COVID test is positive, you should quarantine until you are fever free for 24 hours and you are starting to feel better, and then take added precautions for the next 5 days, such as physical distancing/wearing a mask and good hand hygiene/washing.
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
## 2023-04-19 ENCOUNTER — Telehealth: Payer: Medicaid Other | Admitting: Family Medicine

## 2023-04-19 NOTE — Telephone Encounter (Signed)
Called pt and left a detailed voicemail and advised her to be here 30 mins before appt to complete paperwork.

## 2023-04-19 NOTE — Telephone Encounter (Signed)
Patient has been scheduled for new patient appointment on 04/19/2023 at 10 AM. Please call patient and make sure she is aware she needs to be present at least greater than 30 minutes before her scheduled appointment in order to complete required paperwork for check-in and required history to be completed before her 10:00 appointment  Thanks

## 2023-04-22 ENCOUNTER — Encounter: Payer: Self-pay | Admitting: Family Medicine

## 2023-04-22 NOTE — Progress Notes (Unsigned)
Patient ID: Rhonda Valdez, female  DOB: Nov 30, 2000, 22 y.o.   MRN: [id number] Patient Care Team    Relationship Specialty Notifications Start End  Natalia Leatherwood, DO PCP - General Family Medicine  04/23/23     Chief Complaint  Patient presents with   Establish Care    Neuro referral; migraines, vision changes in right eye, dizziness, brain fog    Subjective:  Rhonda Valdez is a 22 y.o.  female present for new patient establishment. All past medical history, surgical history, allergies, family history, immunizations, medications and social history were updated in the electronic medical record today. All recent labs, ED visits and hospitalizations within the last year were reviewed. No records provided prior to visit.  Patient presents today for new patient establishment and like to discuss her migraine history.  Patient reports she has had chronic migraines since she was "younger.  "She is to be able to go in a dark room and lay down and migraines would resolve en route.  She has tried Tylenol and ibuprofen without much relief in the past. Over the last 3 years she feels her migraines are worsening.  She becomes dizzy and nauseated with the migraines.  Laying down in a dark room or taking OTC medications are not helpful.  She also endorses complete vision loss of her right eye for 1 minute to 1 hour, that is mostly associated with her migraines, but has occurred with the migraine in the past.  Migraines have also occurred without having the vision loss. She reports when she has the complete vision loss in the right eye, it is not black or white, it is like her eye does not exist.  When she looks in the mirror, visually her eye appears normal, but she feels a lot of pressure behind her eyes during this time.  She denies sharp pain behind her eye or wateriness of her eye during these times. She has noticed no obvious triggers to her migraines which she states occur 2-4 times monthly  and can last 1 to 2 days each.  She has noticed that if she is fatigued or physically exerted, she may have a migraine.  Last migraine was April 16, 2023 when she was at [facility].  She states that occurred at the end of the day when she was tired.   Review of EMR/Care Everywhere: Saw cardiology 12/2021 for atypical chest pain, for palpitations.  EKG sinus rhythm.  Felt symptoms were anxiety related Holter monitor for a week, echocardiogram Dr. Lucienne Minks k badal  Depo-Provera 10/18/2022.  No longer on BC.  First menstrual cycle recently since discontinuing Depo-Provera.Patient's last menstrual period was 04/16/2023 (approximate).  Pneumonia 10/31  Right upper quadrant pain 10/30  B12 approximately 100, mildly elevated TSH, then normal.  High MMA, cortisol normal.  ANA positive with negative reflex.  Echo 01/18/2022: 01/18/2022 1:06 PM EDT  Left Ventricle: Left ventricle size is normal.   Left Ventricle: Systolic function is normal. EF: 55-60%.   Right Ventricle: Right ventricle size is normal.   Right Ventricle: Systolic function is normal.   Tricuspid Valve: The right ventricular systolic pressure is normal (<36 mmHg).   No significant valvular abnormalities.    04/23/2023   10:17 AM  Depression screen PHQ 2/9  Decreased Interest 1  Down, Depressed, Hopeless 0  PHQ - 2 Score 1  Altered sleeping 3  Tired, decreased energy 3  Change in appetite 1  Feeling bad or failure about yourself  0  Trouble concentrating 2  Moving slowly or fidgety/restless 0  Suicidal thoughts 0  PHQ-9 Score 10  Difficult doing work/chores Somewhat difficult      04/23/2023   10:17 AM  GAD 7 : Generalized Anxiety Score  Nervous, Anxious, on Edge 2  Control/stop worrying 3  Worry too much - different things 3  Trouble relaxing 3  Restless 2  Easily annoyed or irritable 0  Afraid - awful might happen 1  Total GAD 7 Score 14  Anxiety Difficulty Not difficult at all          04/23/2023   10:16 AM   Fall Risk   Falls in the past year? 0  Number falls in past yr: 0  Injury with Fall? 0  Risk for fall due to : No Fall Risks  Follow up Falls evaluation completed   Immunization History  Administered Date(s) Administered   DTaP 08/18/2002, 11/23/2002, 02/01/2003, 01/24/2004, 07/15/2007   Fluzone Influenza virus vaccine,trivalent (IIV3), split virus 01/24/2004, 05/05/2010, 01/20/2015   HIB (PRP-T) 08/18/2002, 11/23/2002, 02/01/2003, 11/15/2003   HPV 9-valent 01/20/2015   Hepatitis A, Ped/Adol-2 Dose 07/05/2006, 07/15/2007   Hepatitis B, PED/ADOLESCENT Mar 19, 2003, 07/21/2002, 03/17/2003   Hpv-Unspecified 09/12/2012, 09/14/2013   IPV 08/18/2002, 11/23/2002, 01/24/2004, 07/15/2007   Influenza, Seasonal, Injecte, Preservative Fre 02/20/2012   Influenza,inj,Quad PF,6+ Mos 02/06/2017, 01/29/2020, 01/24/2021   MMR 06/18/2003, 07/15/2007   Meningococcal Conjugate 09/14/2013, 07/13/2019   Moderna Sars-Covid-2 Vaccination 06/24/2020   Pneumococcal Conjugate PCV 7 08/18/2002, 11/23/2002, 02/01/2003, 11/15/2003   Tdap 09/12/2012   Varicella 06/18/2003, 07/15/2007    No results found.  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   Allergic rhinitis 03/20/2012   Anxiety    Anxiety disorder 05/11/2011   Asthma 02/05/2012   Autism    Bipolar disorder (HCC)    age 80   Chronic tension-type headache, not intractable 05/30/2021   Depression    Migraine with aura and without status migrainosus, not intractable 05/30/2021   Panic    Pectus excavatum 03/21/2020   Sensory disorder    Allergies  Allergen Reactions   Latex Other (See Comments)    Burns   Milk (Cow) Nausea And Vomiting, Nausea Only and Other (See Comments)    Lactose intolerance as a baby, now make her feel bad    Milk-Related Compounds Other (See Comments)    Lactose intolerance as a baby, now make her feel bad    Past Surgical History:  Procedure Laterality Date   TONSILLECTOMY     age 45   Family History  Problem Relation Age of Onset   Rheum arthritis Mother    Bipolar disorder Mother    Anxiety disorder Mother    Depression Mother    Post-traumatic stress disorder Father    Bipolar disorder Father    Sexual abuse Sister    Anxiety disorder Sister    Bipolar disorder Paternal Grandfather    Post-traumatic stress disorder Paternal Grandfather    Bipolar disorder Paternal Uncle    Social History   Social History Narrative   Not on file    Allergies as of 04/23/2023       Reactions   Latex Other (See Comments)   Burns   Milk (cow) Nausea And Vomiting, Nausea Only, Other (See Comments)   Lactose intolerance as a  baby, now make her feel bad   Milk-related Compounds Other (See Comments)   Lactose intolerance as a baby, now make her feel bad         Medication List        Accurate as of April 23, 2023 11:04 AM. If you have any questions, ask your nurse or doctor.          STOP taking these medications    benzonatate 100 MG capsule Commonly known as: TESSALON Stopped by: Felix Pacini   clindamycin 1 % external solution Commonly known as: CLEOCIN T Stopped by: Felix Pacini   hydrocortisone 2.5 % cream Stopped by: Felix Pacini   hydrOXYzine 10 MG tablet Commonly known as: ATARAX Stopped by: Felix Pacini   Melatonin 10 MG Tabs Stopped by: Felix Pacini   melatonin 5 MG Tabs Stopped by: Felix Pacini   triamcinolone 55 MCG/ACT Aero nasal inhaler Commonly known as: NASACORT Stopped by: Felix Pacini       TAKE these medications    ondansetron 4 MG disintegrating tablet Commonly known as: ZOFRAN-ODT Take 1 tablet (4 mg total) by mouth every 8 (eight) hours as needed for nausea or vomiting.   rizatriptan 5 MG tablet Commonly known as: MAXALT Take  1 tablet (5 mg total) by mouth as needed for migraine. May repeat in 2 hours if needed Started by: Felix Pacini        All past medical history, surgical history, allergies, family history, immunizations andmedications were updated in the EMR today and reviewed under the history and medication portions of their EMR.    No results found for this or any previous visit (from the past 2160 hours).  No results found.   ROS 14 pt review of systems performed and negative (unless mentioned in an HPI)  Objective: BP 110/80   Pulse 61   Temp 97.8 F (36.6 C)   Ht 5' 3.58" (1.615 m)   Wt 121 lb 6.4 oz (55.1 kg)   LMP 04/16/2023 (Approximate)   SpO2 100%   BMI 21.11 kg/m  Physical Exam Vitals and nursing note reviewed.  Constitutional:      General: She is not in acute distress.    Appearance: Normal appearance. She is not ill-appearing, toxic-appearing or diaphoretic.  HENT:     Head: Normocephalic and atraumatic.  Eyes:     General: No scleral icterus.       Right eye: No discharge.        Left eye: No discharge.     Extraocular Movements: Extraocular movements intact.     Conjunctiva/sclera: Conjunctivae normal.     Pupils: Pupils are equal, round, and reactive to light.  Neck:     Comments: No thyromegaly or nodules Cardiovascular:     Rate and Rhythm: Normal rate and regular rhythm.     Heart sounds: No murmur heard. Pulmonary:     Effort: Pulmonary effort is normal. No respiratory distress.     Breath sounds: Normal breath sounds. No wheezing, rhonchi or rales.  Musculoskeletal:     Cervical back: Neck supple. No tenderness.     Right lower leg: No edema.     Left lower leg: No edema.  Lymphadenopathy:     Cervical: No cervical adenopathy.  Skin:    General: Skin is warm.     Findings: No rash.  Neurological:     Mental Status: She is alert and oriented to person, place, and time. Mental status is at baseline.  Motor: No weakness.     Gait: Gait normal.   Psychiatric:        Mood and Affect: Mood normal.        Behavior: Behavior normal.        Thought Content: Thought content normal.        Judgment: Judgment normal.      Assessment/plan: Vitoria Lovorn is a 22 y.o. female present for est care/with acute concerns.  Encounter for medical examination to establish care Chronic migraine without aura without status migrainosus, not intractable (Primary) Longstanding history of chronic migraines worsening over the last 3 years associated with right complete vision loss of right eye lasting 1 minute - 1 hour. Patient reports 2-4 migraines in a month.  We discussed abortive medications versus prophylactic medications.  She has never been prescribed medications for her migraines Maxalt 5 mg as needed prescribed.  Patient was provided with instructions on proper use. Zofran 4 mg every 8 hours as needed for nausea associated with migraines. - Comp Met (CMET) - Magnesium - Sedimentation rate - Ambulatory referral to Neurology - Ambulatory referral to Ophthalmology  Brain fog/B12 deficiency Patient has a history of B12 deficiency since 2023 per EMR review.  Patient reports she was not aware of her low B12 status.  She does not supplement. We discussed deficiency and B12 to these levels can be the cause of brain fog and increased migraines. Encouraged her to start sublingual B12 1000-2000 mcg daily. Will help B12 deficiency, folate deficiency, vitamin D deficiency and electrolyte abnormality with labs today. Depending upon magnesium levels, she may benefit from adding a magnesium supplement nightly. - Comp Met (CMET) - Magnesium - B12 and Folate Panel - Vitamin D (25 hydroxy) - Ambulatory referral to Neurology  Hyponatremia Noted patient had significant hyponatremia present on labs in October.  She was ill during this time. Will retest today to ensure sodium levels are normal and not needing further evaluation on cause. - Comp Met  (CMET)  Vision loss of right eye Patient had reported complete vision loss with pressure behind her eye on the right side that can be associated with her migraines, but occasionally occurs without the migraine.  Last 1 minute - 1 hour. We discussed referring to ophthalmology for complete eye exam is needed.  Given age, most likely it is related to atypical migraines, but cannot rule out other ophthalmologic causes. - Sedimentation rate - Ambulatory referral to Neurology - Ambulatory referral to Ophthalmology  Return if symptoms worsen or fail to improve.  Orders Placed This Encounter  Procedures   Comp Met (CMET)   Magnesium   B12 and Folate Panel   Vitamin D (25 hydroxy)   Sedimentation rate   Ambulatory referral to Neurology   Ambulatory referral to Ophthalmology   Meds ordered this encounter  Medications   rizatriptan (MAXALT) 5 MG tablet    Sig: Take 1 tablet (5 mg total) by mouth as needed for migraine. May repeat in 2 hours if needed    Dispense:  10 tablet    Refill:  11   ondansetron (ZOFRAN-ODT) 4 MG disintegrating tablet    Sig: Take 1 tablet (4 mg total) by mouth every 8 (eight) hours as needed for nausea or vomiting.    Dispense:  20 tablet    Refill:  11   Referral Orders         Ambulatory referral to Neurology         Ambulatory referral to Ophthalmology  Note is dictated utilizing voice recognition software. Although note has been proof Valdez prior to signing, occasional typographical errors still can be missed. If any questions arise, please do not hesitate to call for verification.  Electronically signed by: Felix Pacini, DO [facility]

## 2023-04-22 NOTE — Patient Instructions (Signed)

## 2023-04-23 ENCOUNTER — Encounter: Payer: Self-pay | Admitting: Family Medicine

## 2023-04-23 ENCOUNTER — Ambulatory Visit (INDEPENDENT_AMBULATORY_CARE_PROVIDER_SITE_OTHER): Payer: Self-pay | Admitting: Family Medicine

## 2023-04-23 VITALS — BP 110/80 | HR 61 | Temp 97.8°F | Ht 63.58 in | Wt 121.4 lb

## 2023-04-23 DIAGNOSIS — E871 Hypo-osmolality and hyponatremia: Secondary | ICD-10-CM

## 2023-04-23 DIAGNOSIS — Z0001 Encounter for general adult medical examination with abnormal findings: Secondary | ICD-10-CM

## 2023-04-23 DIAGNOSIS — H5461 Unqualified visual loss, right eye, normal vision left eye: Secondary | ICD-10-CM

## 2023-04-23 DIAGNOSIS — E538 Deficiency of other specified B group vitamins: Secondary | ICD-10-CM

## 2023-04-23 DIAGNOSIS — Z0000 Encounter for general adult medical examination without abnormal findings: Secondary | ICD-10-CM

## 2023-04-23 DIAGNOSIS — R768 Other specified abnormal immunological findings in serum: Secondary | ICD-10-CM | POA: Insufficient documentation

## 2023-04-23 DIAGNOSIS — G43E09 Chronic migraine with aura, not intractable, without status migrainosus: Secondary | ICD-10-CM

## 2023-04-23 DIAGNOSIS — R4189 Other symptoms and signs involving cognitive functions and awareness: Secondary | ICD-10-CM

## 2023-04-23 LAB — COMPREHENSIVE METABOLIC PANEL
ALT: 18 U/L (ref 0–35)
AST: 18 U/L (ref 0–37)
Albumin: 4.3 g/dL (ref 3.5–5.2)
Alkaline Phosphatase: 65 U/L (ref 39–117)
BUN: 11 mg/dL (ref 6–23)
CO2: 29 meq/L (ref 19–32)
Calcium: 9.3 mg/dL (ref 8.4–10.5)
Chloride: 106 meq/L (ref 96–112)
Creatinine, Ser: 0.63 mg/dL (ref 0.40–1.20)
GFR: 127.31 mL/min (ref >60.00)
Glucose, Bld: 89 mg/dL (ref 70–99)
Potassium: 4.4 meq/L (ref 3.5–5.1)
Sodium: 141 meq/L (ref 135–145)
Total Bilirubin: 1.1 mg/dL (ref 0.2–1.2)
Total Protein: 6.4 g/dL (ref 6.0–8.3)

## 2023-04-23 LAB — B12 AND FOLATE PANEL
Folate: 9.1 ng/mL (ref >5.9)
Vitamin B-12: 122 pg/mL — ABNORMAL LOW (ref 211–911)

## 2023-04-23 LAB — SEDIMENTATION RATE: Sed Rate: <1 mm/h (ref 0–20)

## 2023-04-23 LAB — VITAMIN D 25 HYDROXY (VIT D DEFICIENCY, FRACTURES): VITD: 11.75 ng/mL — ABNORMAL LOW (ref 30.00–100.00)

## 2023-04-23 LAB — MAGNESIUM: Magnesium: 1.7 mg/dL (ref 1.5–2.5)

## 2023-04-23 MED ORDER — ONDANSETRON 4 MG PO TBDP: 4.0000 mg | ORAL_TABLET | Freq: Three times a day (TID) | ORAL | 11 refills | Status: AC | PRN

## 2023-04-23 MED ORDER — RIZATRIPTAN BENZOATE 5 MG PO TABS: 5.0000 mg | ORAL_TABLET | ORAL | 11 refills | Status: AC | PRN

## 2023-04-29 ENCOUNTER — Telehealth: Payer: Self-pay | Admitting: Family Medicine

## 2023-04-29 MED ORDER — VITAMIN D (ERGOCALCIFEROL) 1.25 MG (50000 UNIT) PO CAPS: 50000.0000 [IU] | ORAL_CAPSULE | ORAL | 0 refills | Status: AC

## 2023-04-29 NOTE — Telephone Encounter (Signed)
LM for pt to return call to discuss.  

## 2023-04-29 NOTE — Telephone Encounter (Signed)
Please call patient concerning her results: Her electrolytes, inflammatory marker, folate, liver and kidney function are normal. Her B12 is extremely low at 122.  Her vitamin D is extremely low at 11.75. Her magnesium is on the lower side of normal.  Recommendations: Start OTC sublingual B12 solution or dissolvable tab 2000 mcg daily. Start OTC vitamin D 2000 units daily, this will be continued for ever.  In order to help her get her vitamin D levels up into normal range, I also prescribed a once weekly high-dose vitamin D for 12 weeks (again, OTC will be daily forever to keep levels).  She should also start a magnesium glycinate 400 mg supplement before bed.    With the start of all the above she should see improvement in her brain fog and migraines.  Follow-up in 3 months

## 2023-04-29 NOTE — Telephone Encounter (Signed)
Ergocalciferol once weekly prescribed for 12 weeks

## 2023-04-30 NOTE — Telephone Encounter (Signed)
Letter mailed

## 2023-04-30 NOTE — Telephone Encounter (Signed)
 LM for pt to return call to discuss.
